# Patient Record
Sex: Female | Born: 1995 | Hispanic: No | Marital: Single | Smoking: Never smoker
Health system: Southern US, Community
[De-identification: ages and names within clinical notes are randomized; demographics above are authoritative.]

## PROBLEM LIST (undated history)

## (undated) DIAGNOSIS — J45909 Unspecified asthma, uncomplicated: Secondary | ICD-10-CM

## (undated) HISTORY — PX: MOUTH SURGERY: SHX715

---
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
## 2013-01-30 ENCOUNTER — Encounter (HOSPITAL_COMMUNITY): Payer: Self-pay | Admitting: Emergency Medicine

## 2013-01-30 ENCOUNTER — Emergency Department (INDEPENDENT_AMBULATORY_CARE_PROVIDER_SITE_OTHER)
Admission: EM | Admit: 2013-01-30 | Discharge: 2013-01-30 | Disposition: A | Discharge status: Home / Self Care | Payer: Medicaid Other | Source: Home / Self Care | Attending: Emergency Medicine | Admitting: Emergency Medicine

## 2013-01-30 DIAGNOSIS — G510 Bell's palsy: Secondary | ICD-10-CM

## 2013-01-30 HISTORY — DX: Unspecified asthma, uncomplicated: J45.909

## 2013-01-30 MED ORDER — PREDNISONE 20 MG PO TABS: ORAL_TABLET | ORAL | Status: AC

## 2013-01-30 MED ORDER — VALACYCLOVIR HCL 1 G PO TABS: 1000.0000 mg | ORAL_TABLET | Freq: Three times a day (TID) | ORAL | Status: AC

## 2013-01-30 MED ORDER — POLYETHYL GLYCOL-PROPYL GLYCOL 0.4-0.3 % OP GEL: OPHTHALMIC | Status: AC

## 2013-01-30 MED ORDER — POLYETHYL GLYCOL-PROPYL GLYCOL 0.4-0.3 % OP SOLN: OPHTHALMIC | Status: AC

## 2013-01-30 NOTE — ED Notes (Signed)
Pt c/o facial numbness of left side onset 01/28/13 Has noticed asymmetrical mouth and eyes, left ear pain and left eye irritation Only face is affected She is alert and oriented w/no signs of acute distress.

## 2013-01-30 NOTE — ED Notes (Signed)
Evaluated patient at registration.  Initially noticed mouth numbness yesterday.  Today woke with severe ear pain, followed by left side of face not moving like usual.  Asymmetrical  Left and right facial movement.

## 2013-01-30 NOTE — ED Provider Notes (Signed)
Chief Complaint:   Chief Complaint  Patient presents with  . Facial Droop    History of Present Illness:   Anita Burns is a 17 year old female who has had a three-day history of left facial weakness and numbness. She's had some pain and dryness of the left eye, left ear pain, her tongue seems numb, her mouth is dry, and she's had alteration in her sense of taste. She had a slight headache but no fever or chills. She denies any stiff neck or swollen glands. There is no skin rash. No history of diabetes or tick bite. She denies any weakness of the upper lower extremities, numbness in the upper or lower extremities, difficulty with speech, swallowing, ambulation, coordination, or balance.  Review of Systems:  Other than noted above, the patient denies any of the following symptoms: Systemic:  No fever, chills, fatigue, photophobia, stiff neck. Eye:  No redness, eye pain, discharge, blurred vision, or diplopia. ENT:  No nasal congestion, rhinorrhea, sinus pressure or pain, sneezing, earache, or sore throat.  No jaw claudication. Neuro:  No paresthesias, loss of consciousness, seizure activity, muscle weakness, trouble with coordination or gait, trouble speaking or swallowing. Psych:  No depression, anxiety or trouble sleeping.  PMFSH:  Past medical history, family history, social history, meds, and allergies were reviewed.    Physical Exam:   Vital signs:  BP 128/76  Pulse 103  Temp(Src) 98.9 F (37.2 C) (Oral)  Resp 16  SpO2 99%  LMP 01/28/2013 General:  Alert and oriented.  In no distress. Eye:  Lids and conjunctivas normal.  PERRL,  Full EOMs.  Fundi benign with normal discs and vessels. Her left eye is watering, but the cornea appears intact. ENT:  No cranial or facial tenderness to palpation.  TMs and canals clear.  Nasal mucosa was normal and uncongested without any drainage. No intra oral lesions, pharynx clear, mucous membranes moist, dentition normal. Neck:  Supple, full ROM, no  tenderness to palpation.  No adenopathy or mass. No carotid bruit. Lungs: Clear to auscultation. Heart: Regular rhythm, no gallop or murmur. Neuro:  Alert and orented times 3.  Speech was clear, fluent, and appropriate.  She has a moderate left peripheral seventh nerve palsy, she is able to close her eye completely. No pronator drift, muscle strength normal. Finger to nose normal.  DTRs were 2+ and symmetrical.Station and gait were normal.  Romberg's sign was normal.  Able to perform tandem gait well. Psych:  Normal affect.  Assessment:  The encounter diagnosis was Bell's palsy.  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 Will treat with prednisone taper and Valtrex. She was given Systane drops and gel to prevent dryness of the eye and suggested followup with a neurologist and ophthalmologist.  Plan:   1.  The following meds were prescribed:   Discharge Medication List as of 01/30/2013  7:19 PM    START taking these medications   Details  Polyethyl Glycol-Propyl Glycol (SYSTANE) 0.4-0.3 % GEL Apply a small dab to left eye at bedtime, Normal    Polyethyl Glycol-Propyl Glycol (SYSTANE) 0.4-0.3 % SOLN 1 drop in left eye every 2 hours while awake, Normal    predniSONE (DELTASONE) 20 MG tablet 3 daily for 3 days, 2 daily for 3 days, 1 daily for 3 days., Normal    valACYclovir (VALTREX) 1000 MG tablet Take 1 tablet (1,000 mg total) by mouth 3 (three) times daily., Starting 01/30/2013, Last dose on Fri 02/13/13, Normal  2.  The patient was instructed in symptomatic care and handouts were given. 3.  The patient was told to return if becoming worse in any way, if no better in 3 or 4 days, and given some red flag symptoms such as fever or changing neurological symptoms that would indicate earlier return. 4.  Follow up with Dr. Vickey Huger and Dr. Charlotte Sanes for neurology and ophthalmology respectively.    Reuben Likes, MD 01/30/13 2215

## 2013-11-26 ENCOUNTER — Encounter (HOSPITAL_COMMUNITY): Payer: Self-pay | Admitting: Emergency Medicine

## 2013-11-26 ENCOUNTER — Emergency Department (INDEPENDENT_AMBULATORY_CARE_PROVIDER_SITE_OTHER)
Admission: EM | Admit: 2013-11-26 | Discharge: 2013-11-26 | Disposition: A | Discharge status: Home / Self Care | Payer: Medicaid Other | Source: Home / Self Care | Attending: Emergency Medicine | Admitting: Emergency Medicine

## 2013-11-26 DIAGNOSIS — S8000XA Contusion of unspecified knee, initial encounter: Secondary | ICD-10-CM

## 2013-11-26 DIAGNOSIS — S39012A Strain of muscle, fascia and tendon of lower back, initial encounter: Secondary | ICD-10-CM

## 2013-11-26 DIAGNOSIS — S335XXA Sprain of ligaments of lumbar spine, initial encounter: Secondary | ICD-10-CM

## 2013-11-26 DIAGNOSIS — Y92410 Unspecified street and highway as the place of occurrence of the external cause: Secondary | ICD-10-CM

## 2013-11-26 DIAGNOSIS — J069 Acute upper respiratory infection, unspecified: Secondary | ICD-10-CM

## 2013-11-26 LAB — POCT RAPID STREP A: Streptococcus, Group A Screen (Direct): NEGATIVE

## 2013-11-26 MED ORDER — AMOXICILLIN-POT CLAVULANATE 875-125 MG PO TABS: 1.0000 | ORAL_TABLET | Freq: Two times a day (BID) | ORAL | Status: AC

## 2013-11-26 MED ORDER — BENZONATATE 200 MG PO CAPS: 200.0000 mg | ORAL_CAPSULE | Freq: Three times a day (TID) | ORAL | Status: AC | PRN

## 2013-11-26 MED ORDER — METHOCARBAMOL 500 MG PO TABS: 500.0000 mg | ORAL_TABLET | Freq: Three times a day (TID) | ORAL | Status: AC

## 2013-11-26 MED ORDER — MELOXICAM 15 MG PO TABS: 15.0000 mg | ORAL_TABLET | Freq: Every day | ORAL | Status: AC

## 2013-11-26 MED ORDER — IPRATROPIUM BROMIDE 0.06 % NA SOLN: 2.0000 | Freq: Four times a day (QID) | NASAL | Status: AC

## 2013-11-26 MED ORDER — TRAMADOL HCL 50 MG PO TABS: 100.0000 mg | ORAL_TABLET | Freq: Three times a day (TID) | ORAL | Status: DC | PRN
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
## 2013-11-26 NOTE — Discharge Instructions (Signed)
Do exercises twice daily followed by moist heat for 15 minutes.      Try to be as active as possible.  If no better in 2 weeks, follow up with orthopedist.   Knee pain can be caused by many conditions:  Osteoarthritis, gout, bursitis, tendonitis, cartiledge damage, condromalacia patella, patellofemoral syndrome, and ligament sprain to name just a few.  Often some simple conservative measures can help alleviate the pain.  Do not do the following:  Avoid squatting and doing deep knee bends.  This puts too much of load on your cartiledges and tendons.  If you do a knee bend, go only half way down, flexing your knee no more than 90 degrees.  Do the following:  If you are overweight or obese, lose weight.  This makes for a lot less load on your knee joints.  If you use tobacco, quit.  Nicotine causes spasm of the small arteries, decreases blood flow, and impairs your body's normal ability to repair damage.  If your knee is acutely inflamed, use the principles of RICE (rest, ice, compression, and elevation).  Wearing a knee brace can help.  These are usually made of neoprene and can be purchased over the counter at the drug store.  Use of over the counter pain meds can be of help.  Tylenol (or acetaminophen) is the safest to use.  It often helps to take this regularly.  You can take up to 2 325 mg tablets 5 times daily, but it best to start out much lower that that, perhaps 2 325 mg tablets twice daily, then increase from there. People who are on the blood thinner warfarin have to be careful about taking high doses of Tylenol.  For people who are able to tolerate them, ibuprofen and naproxyn can also help with the pain.  You should discuss these agents with your physician before taking them.  People with chronic kidney disease, hypertension, peptic ulcer disease, and reflux can suffer adverse side effects. They should not be taken with warfarin. The maximum dosage of ibuprofen is 800 mg 3 times  daily with meals.  The maximum dosage of naprosyn is 2 and 1/2 tablets twice daily with food, but again, start out low and gradually increase the dose until adequate pain relief is achieved. Ibuprofen and naprosyn should always be taken with food.  People with cartiledge injury or osteoarthritis may find glucosamine to be helpful.  This is an over-the-counter supplement that helps nourish and repair cartiledge.  The dose is 500 mg 3 times daily or 1500 mg taken in a single dose. This can take several months to work and it doesn't always work.    For people with knee pain on just one side, use of a cane held in the hand on the same side as the knee pain takes some of the stress off the knee joint and can make a big difference in knee pain.  Wearing good shoes with adequate arch support is essential.  Regular exercise is of utmost importance.  Swimming, water aerobics, or use of an elliptical exerciser put the least stress on the knees of any exercise.  Finally doing the exercises below can be very helpful.  They tend to strengthen the muscles around the knee and provide extra support and stability.  Try to do them twice a day followed by ice for 10 minutes.       Most upper respiratory infections are caused by viruses and do not require antibiotics.  We  try to save the antibiotics for when we really need them to prevent bacteria from developing resistance to them.  Here are a few hints about things that can be done at home to help get over an upper respiratory infection quicker:  Get extra sleep and extra fluids.  Get 7 to 9 hours of sleep per night and 6 to 8 glasses of water a day.  Getting extra sleep keeps the immune system from getting run down.  Most people with an upper respiratory infection are a little dehydrated.  The extra fluids also keep the secretions liquified and easier to deal with.  Also, get extra vitamin C.  4000 mg per day is the recommended dose. For the aches, headache, and  fever, acetaminophen or ibuprofen are helpful.  These can be alternated every 4 hours.  People with liver disease should avoid large amounts of acetaminophen, and people with ulcer disease, gastroesophageal reflux, gastritis, congestive heart failure, chronic kidney disease, coronary artery disease and the elderly should avoid ibuprofen. For nasal congestion try Mucinex-D, or if you're having lots of sneezing or clear nasal drainage use Zyrtec-D. People with high blood pressure can take these if their blood pressure is controlled, if not, it's best to avoid the forms with a "D" (decongestants).  You can use the plain Mucinex, Allegra, Claritin, or Zyrtec even if your blood pressure is not controlled.   A Saline nasal spray such as Ocean Spray can also help.  You can add a decongestant sprays such as Afrin, but you should not use the decongestant sprays for more than 3 or 4 days since they can be habituating.  Breathe Rite nasal strips can also offer a non-drug alternative treatment to nasal congestion, especially at night. For people with symptoms of sinusitis, sleeping with your head elevated can be helpful.  For sinus pain, moist, hot compresses to the face may provide some relief.  Many people find that inhaling steam as in a shower or from a pot of steaming water can help. For any viral infection, zinc containing lozenges such as Cold-Eze or Zicam are helpful.  Zinc helps to fight viral infection.  Hot salt water gargles (8 oz of hot water, 1/2 tsp of table salt, and a pinch of baking soda) can give relief as well as hot beverages such as hot tea.  Sucrets extra strength lozenges will help the sore throat.  For the cough, take Delsym 2 tsp every 12 hours.  It has also been found recently that Aleve can help control a cough.  The dose is 1 to 2 tablets twice daily with food.  This can be combined with Delsym. (Note, if you are taking ibuprofen, you should not take Aleve as well--take one or the other.) A  cool mist vaporizer will help keep your mucous membranes from drying out.   It's important when you have an upper respiratory infection not to pass the infection to others.  This involves being very careful about the following:  Frequent hand washing or use of hand sanitizer, especially after coughing, sneezing, blowing your nose or touching your face, nose or eyes. Do not shake hands or touch anyone and try to avoid touching surfaces that other people use such as doorknobs, shopping carts, telephones and computer keyboards. Use tissues and dispose of them properly in a garbage can or ziplock bag. Cough into your sleeve. Do not let others eat or drink after you.  It's also important to recognize the signs of serious illness  and get evaluated if they occur: Any respiratory infection that lasts more than 7 to 10 days.  Yellow nasal drainage and sputum are not reliable indicators of a bacterial infection, but if they last for more than 1 week, see your doctor. Fever and sore throat can indicate strep. Fever and cough can indicate influenza or pneumonia. Any kind of severe symptom such as difficulty breathing, intractable vomiting, or severe pain should prompt you to see a doctor as soon as possible.   Your body's immune system is really the thing that will get rid of this infection.  Your immune system is comprised of 2 types of specialized cells called T cells and B cells.  T cells coordinate the array of cells in your body that engulf invading bacteria or viruses while B cells orchestrate the production of antibodies that neutralize infection.  Anything we do or any medications we give you, will just strengthen your immune system or help it clear up the infection quicker.  Here are a few helpful hints to improve your immune system to help overcome this illness or to prevent future infections:  A few vitamins can improve the health of your immune system.  That's why your diet should include plenty  of fruits, vegetables, fish, nuts, and whole grains.  Vitamin A and bet-carotene can increase the cells that fight infections (T cells and B cells).  Vitamin A is abundant in dark greens and orange vegetables such as spinach, greens, sweet potatoes, and carrots.  Vitamin B6 contributes to the maturation of white blood cells, the cells that fight disease.  Foods with vitamin B6 include cold cereal and bananas.  Vitamin C is credited with preventing colds because it increases white blood cells and also prevents cellular damage.  Citrus fruits, peaches and green and red bell peppers are all hight in vitamin C.  Vitamin E is an anti-oxidant that encourages the production of natural killer cells which reject foreign invaders and B cells that produce antibodies.  Foods high in vitamin E include wheat germ, nuts and seeds.  Foods high in omega-3 fatty acids found in foods like salmon, tuna and mackerel boost your immune system and help cells to engulf and absorb germs.  Probiotics are good bacteria that increase your T cells.  These can be found in yogurt and are available in supplements such as Culturelle or Align.  Moderate exercise increases the strength of your immune system and your ability to recover from illness.  I suggest 3 to 5 moderate intensity 30 minute workouts per week.    Sleep is another component of maintaining a strong immune system.  It enables your body to recuperate from the day's activities, stress and work.  My recommendation is to get between 7 and 9 hours of sleep per night.  If you smoke, try to quit completely or at least cut down.  Drink alcohol only in moderation if at all.  No more than 2 drinks daily for men or 1 for women.  Get a flu vaccine early in the fall or if you have not gotten one yet, once this illness has run its course.  If you are over 65, a smoker, or an asthmatic, get a pneumococcal vaccine.  My final recommendation is to maintain a healthy weight.   Excess weight can impair the immune system by interfering with the way the immune system deals with invading viruses or bacteria.  Do not get antibiotic filled unless no better in 2 to 3  days.
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
## 2013-11-28 LAB — CULTURE, GROUP A STREP

## 2014-09-22 ENCOUNTER — Emergency Department (INDEPENDENT_AMBULATORY_CARE_PROVIDER_SITE_OTHER)
Admission: EM | Admit: 2014-09-22 | Discharge: 2014-09-22 | Disposition: A | Discharge status: Home / Self Care | Payer: Medicaid Other | Source: Home / Self Care | Attending: Emergency Medicine | Admitting: Emergency Medicine

## 2014-09-22 ENCOUNTER — Emergency Department (INDEPENDENT_AMBULATORY_CARE_PROVIDER_SITE_OTHER): Payer: Medicaid Other

## 2014-09-22 ENCOUNTER — Encounter (HOSPITAL_COMMUNITY): Payer: Self-pay

## 2014-09-22 DIAGNOSIS — S46111A Strain of muscle, fascia and tendon of long head of biceps, right arm, initial encounter: Secondary | ICD-10-CM

## 2014-09-22 DIAGNOSIS — S161XXA Strain of muscle, fascia and tendon at neck level, initial encounter: Secondary | ICD-10-CM

## 2014-09-22 DIAGNOSIS — S46211A Strain of muscle, fascia and tendon of other parts of biceps, right arm, initial encounter: Secondary | ICD-10-CM

## 2014-09-22 DIAGNOSIS — M7052 Other bursitis of knee, left knee: Secondary | ICD-10-CM | POA: Diagnosis not present

## 2014-09-22 MED ORDER — TRAMADOL HCL 50 MG PO TABS: 50.0000 mg | ORAL_TABLET | Freq: Four times a day (QID) | ORAL | Status: AC | PRN

## 2014-09-22 MED ORDER — IBUPROFEN 800 MG PO TABS: 800.0000 mg | ORAL_TABLET | Freq: Three times a day (TID) | ORAL | Status: AC

## 2014-09-22 NOTE — Discharge Instructions (Signed)
Take ibuprofen 800 mg 3 times a day for the next week, then as needed. Rest your arm and leg as much as possible. Apply ice for 10-20 minutes 3 times a day for the next few days. Use tramadol every 6 hours as needed for severe pain. Follow-up if no improvement in 1-2 weeks.
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
## 2014-09-22 NOTE — ED Provider Notes (Signed)
CSN: [id number]     Arrival date & time 09/22/14  1112 History   First MD Initiated Contact with Patient 09/22/14 1154     Chief Complaint  Patient presents with  . Fall   (Consider location/radiation/quality/duration/timing/severity/associated sxs/prior Treatment) HPI  She is a 19 year old woman here for evaluation of left knee pain and right shoulder pain following a fall. She states she slipped and fell yesterday. She landed hard on her left knee and also twisted her right upper arm. The knee pain is the worse. It is located anteriorly. It is worse with stairs. The shoulder pain is only present with certain movements. She also reports some pain in her left neck. She states the symptoms started over the weekend and worsened after the fall. She has tried Tylenol without improvement.  Past Medical History  Diagnosis Date  . Asthma    Past Surgical History  Procedure Laterality Date  . Mouth surgery     History reviewed. No pertinent family history. History  Substance Use Topics  . Smoking status: Never Smoker   . Smokeless tobacco: Not on file  . Alcohol Use: No   OB History    No data available     Review of Systems As in history of present illness Allergies  Review of patient's allergies indicates no known allergies.  Home Medications   Prior to Admission medications   Medication Sig Start Date End Date Taking? Authorizing Provider  amoxicillin-clavulanate (AUGMENTIN) 875-125 MG per tablet Take 1 tablet by mouth 2 (two) times daily. 11/26/13   Reuben Likesavid C Keller, MD  benzonatate (TESSALON) 200 MG capsule Take 1 capsule (200 mg total) by mouth 3 (three) times daily as needed for cough. 11/26/13   Reuben Likesavid C Keller, MD  ibuprofen (ADVIL,MOTRIN) 800 MG tablet Take 1 tablet (800 mg total) by mouth 3 (three) times daily. 09/22/14   Charm RingsErin J Findley Vi, MD  ipratropium (ATROVENT) 0.06 % nasal spray Place 2 sprays into both nostrils 4 (four) times daily. 11/26/13   Reuben Likesavid C Keller, MD  meloxicam  (MOBIC) 15 MG tablet Take 1 tablet (15 mg total) by mouth daily. 11/26/13   Reuben Likesavid C Keller, MD  methocarbamol (ROBAXIN) 500 MG tablet Take 1 tablet (500 mg total) by mouth 3 (three) times daily. 11/26/13   Reuben Likesavid C Keller, MD  Polyethyl Glycol-Propyl Glycol (SYSTANE) 0.4-0.3 % GEL Apply a small dab to left eye at bedtime 01/30/13   Reuben Likesavid C Keller, MD  Polyethyl Glycol-Propyl Glycol (SYSTANE) 0.4-0.3 % SOLN 1 drop in left eye every 2 hours while awake 01/30/13   Reuben Likesavid C Keller, MD  predniSONE (DELTASONE) 20 MG tablet 3 daily for 3 days, 2 daily for 3 days, 1 daily for 3 days. 01/30/13   Reuben Likesavid C Keller, MD  traMADol (ULTRAM) 50 MG tablet Take 1 tablet (50 mg total) by mouth every 6 (six) hours as needed. 09/22/14   Charm RingsErin J Alyene Predmore, MD   BP 116/70 mmHg  Pulse 98  Temp(Src) 97.9 F (36.6 C) (Oral)  SpO2 99%  LMP 09/08/2014 Physical Exam  Constitutional: She is oriented to person, place, and time. She appears well-developed and well-nourished. No distress.  Neck: Neck supple.  Mild spasm in left superior trapezius.  Cardiovascular: Normal rate.   Pulmonary/Chest: Effort normal.  Musculoskeletal:  Left knee: Small abrasion over her kneecap. No swelling or erythema. No joint effusion. She is tender over the patella and patellar tendon. No joint line tenderness. No crepitus. No joint instability. Right shoulder: No erythema  or swelling. Tender over her biceps tendon and supraspinatus. Positive empty can.  5 out of 5 strength in triceps and biceps.  Neurological: She is alert and oriented to person, place, and time.    ED Course  Procedures (including critical care time) Labs Review Labs Reviewed - No data to display  Imaging Review Dg Knee Complete 4 Views Left  09/22/2014   CLINICAL DATA:  19 year old female who fell yesterday onto left knee with lateral patellar region pain. Initial encounter.  EXAM: LEFT KNEE - COMPLETE 4+ VIEW  COMPARISON:  None.  FINDINGS: Large body habitus. Bone mineralization is  within normal limits. Patella intact. No joint effusion. Joint spaces appear preserved. No acute fracture or dislocation identified.  IMPRESSION: No acute fracture or dislocation identified about the left knee.   Electronically Signed   By: Augusto Gamble M.D.   On: 09/22/2014 12:53     MDM   1. Patellar bursitis, left   2. Biceps strain, right, initial encounter   3. Cervical strain, acute, initial encounter    Conservative management with rest, ice, ibuprofen. Tramadol prescription given for severe pain. Follow-up if no improvement in 1-2 weeks.    Charm Rings, MD 09/22/14 1323

## 2015-10-05 IMAGING — DX DG KNEE COMPLETE 4+V*L*
4 series · 4 of 4 positions shown · non-contrast
Comparison: None.

CLINICAL DATA: 18-year-old female who fell yesterday onto left knee
with lateral patellar region pain. Initial encounter.

EXAM:
LEFT KNEE - COMPLETE 4+ VIEW

[knee ap]
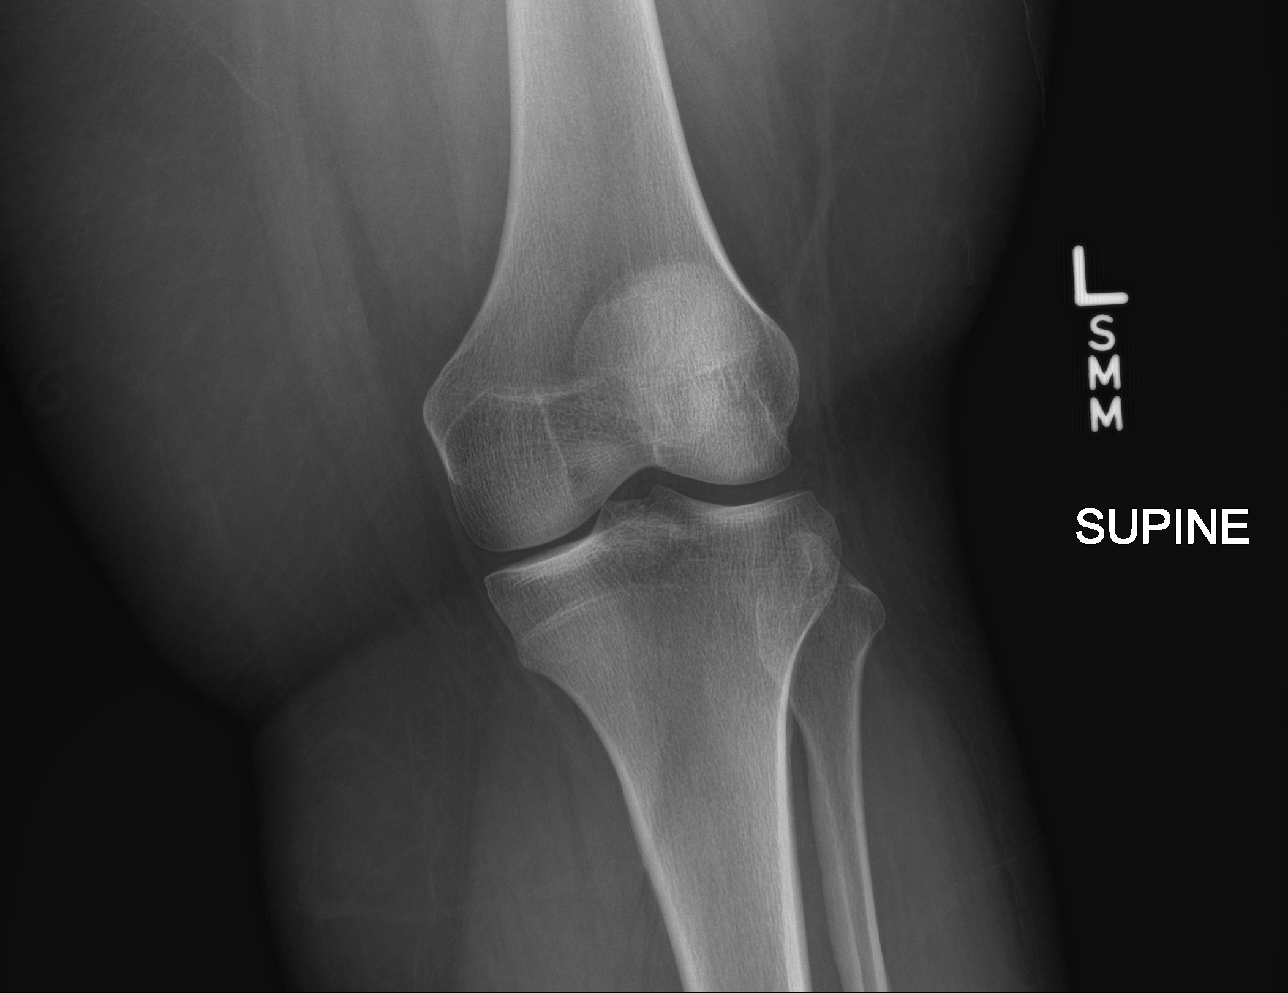

[knee obl (1 of 2)]
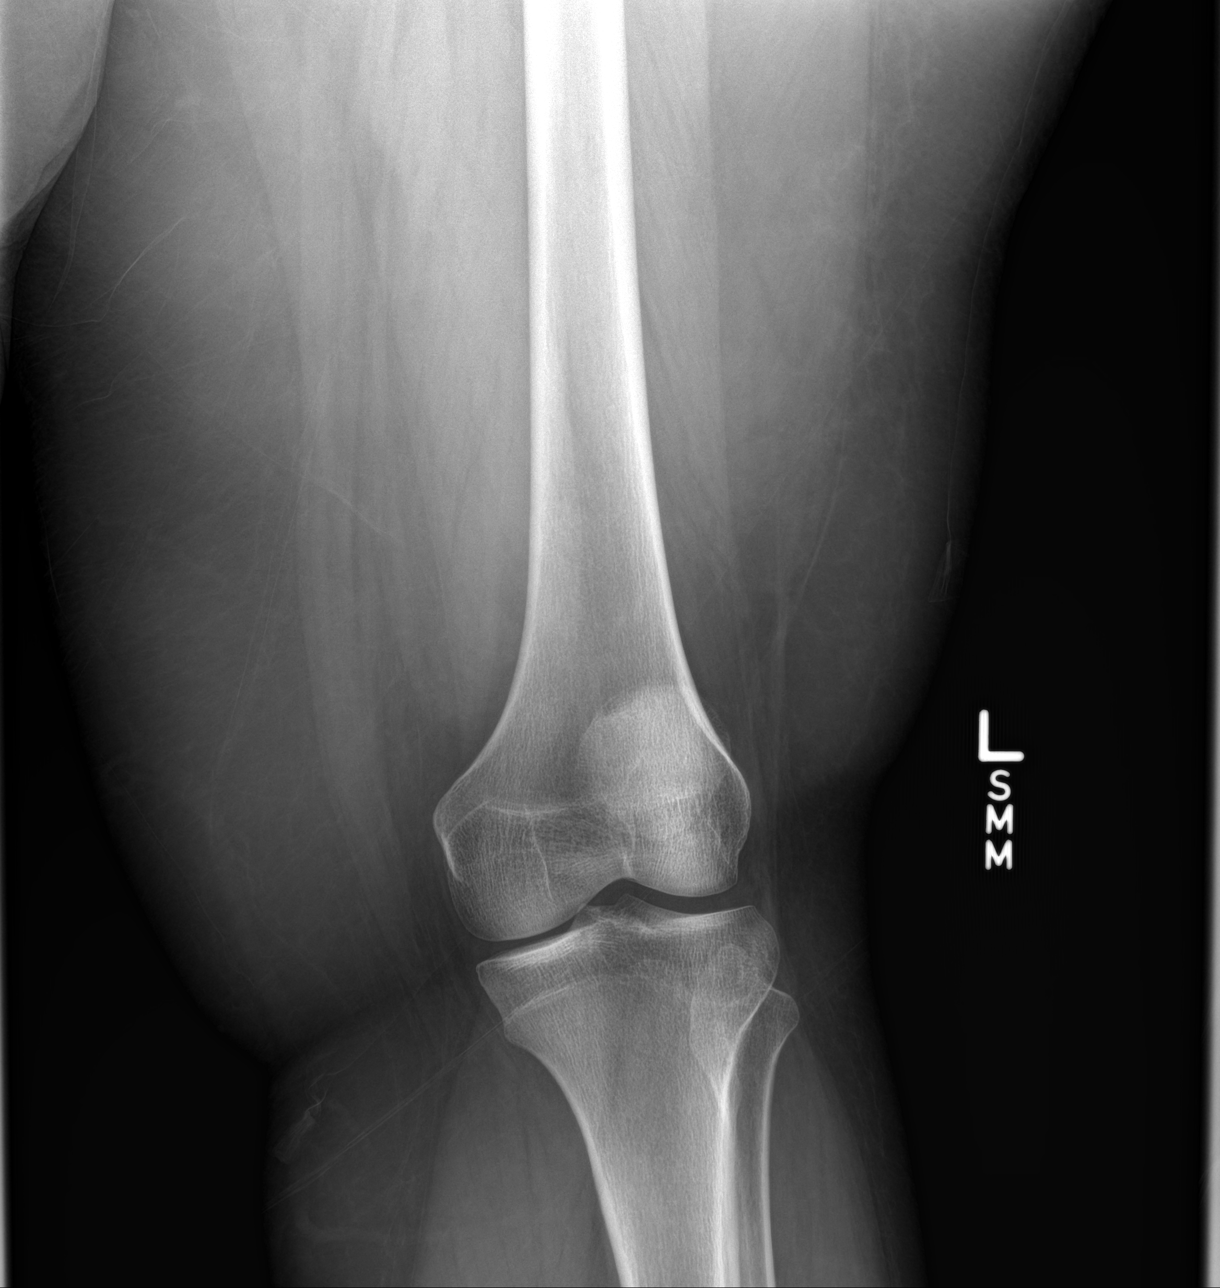

[knee obl (2 of 2)]
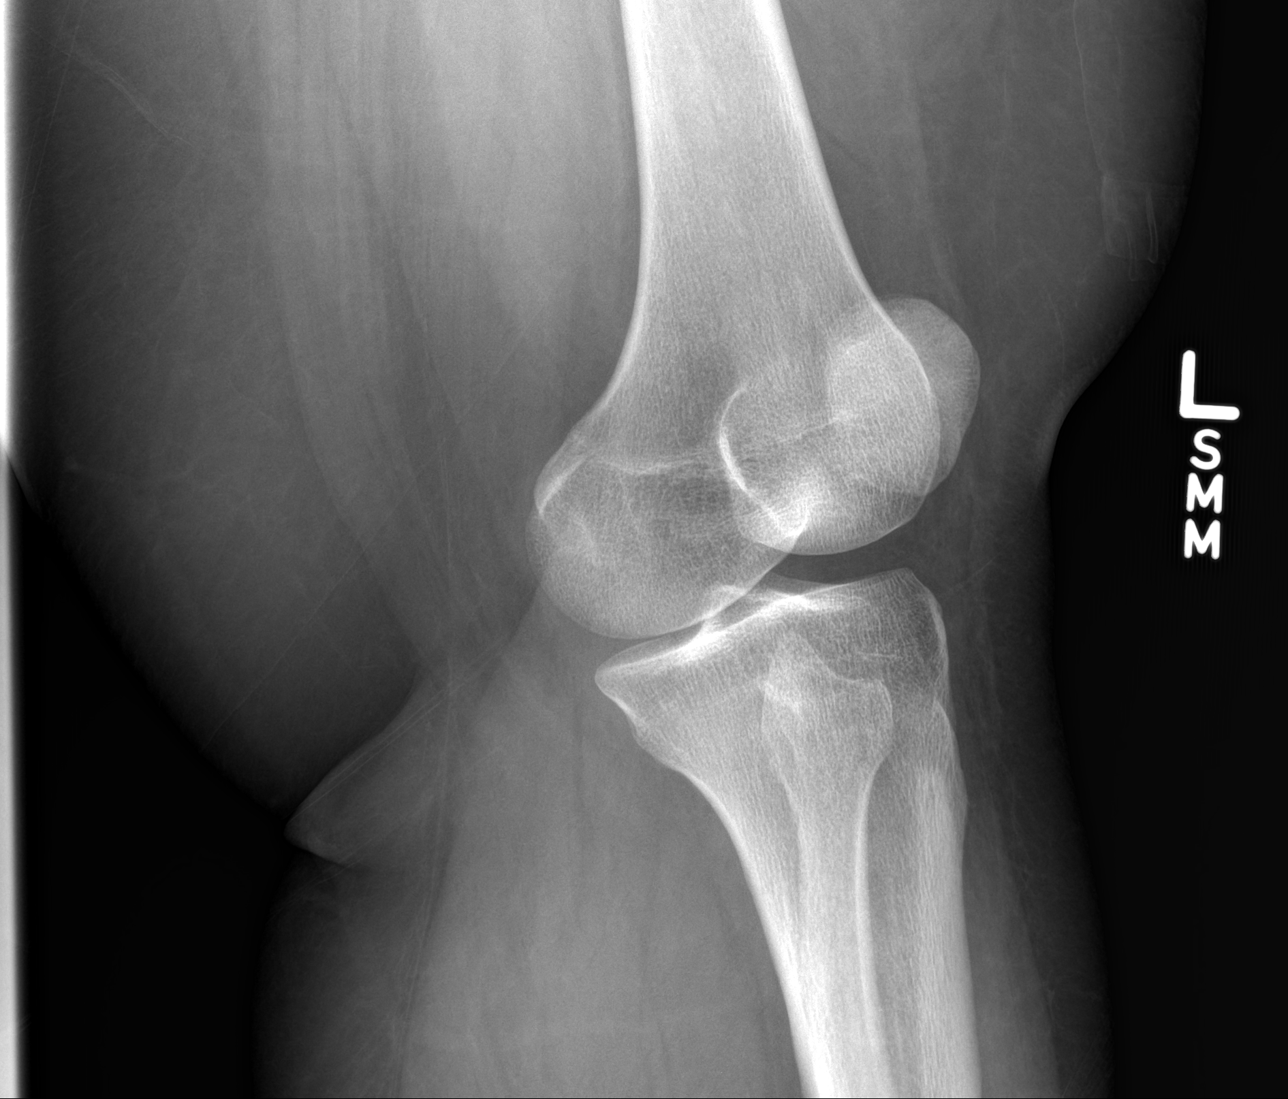

[knee lat]
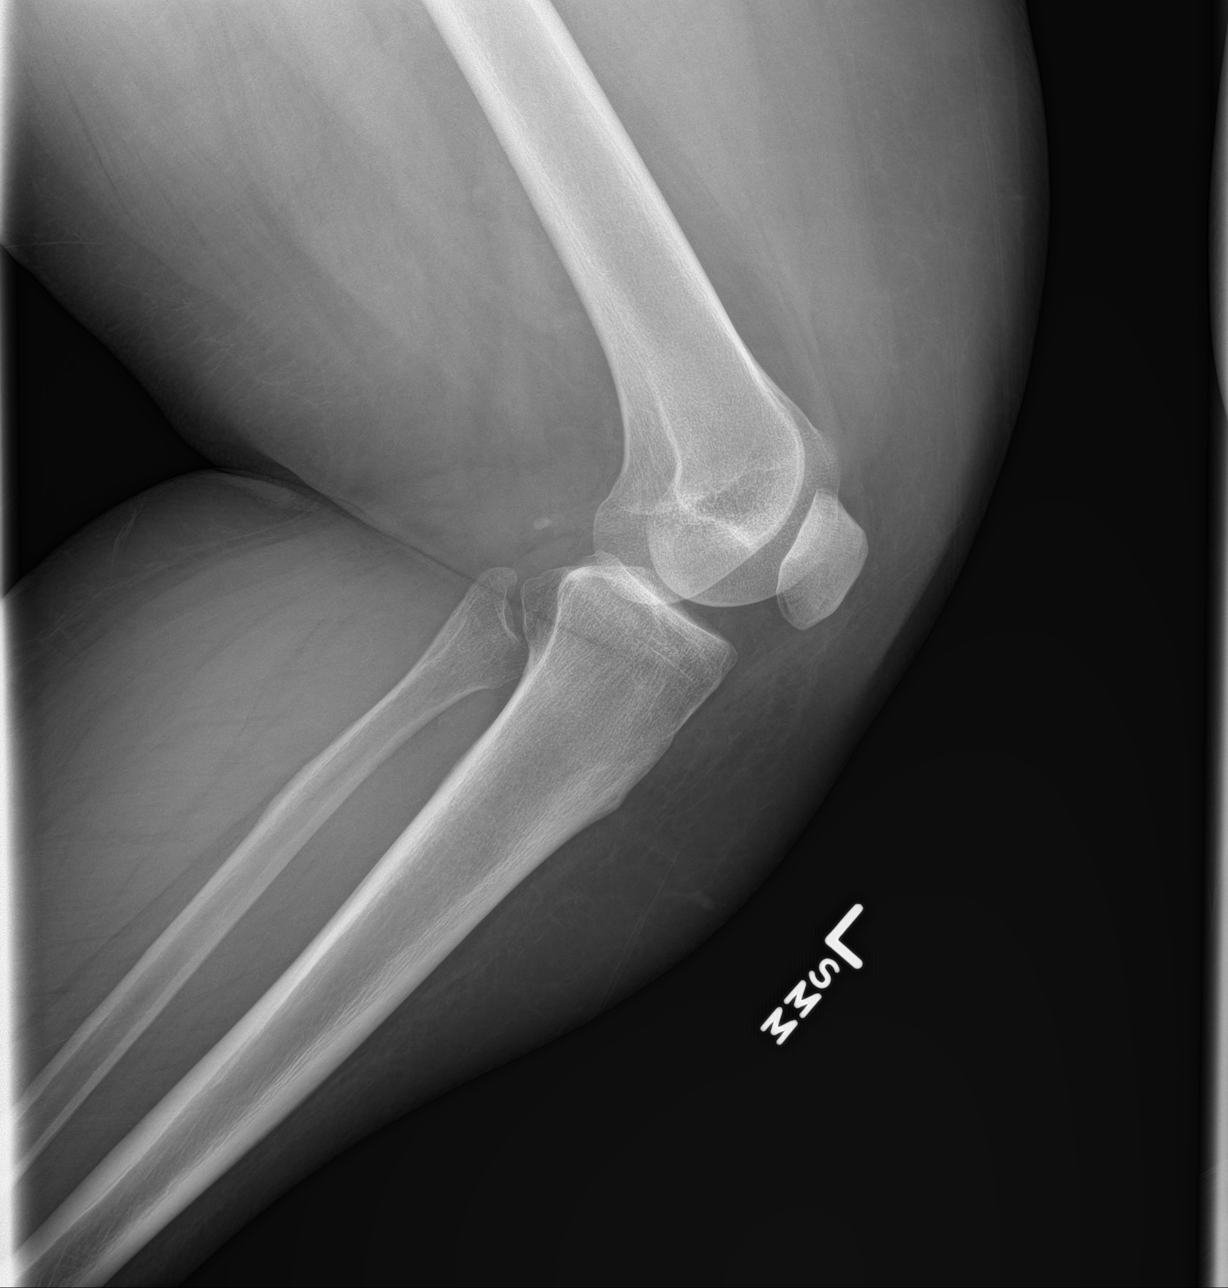

[4 of 4 positions shown; findings below may reference images not displayed]

FINDINGS: Large body habitus. Bone mineralization is within normal limits.
Patella intact. No joint effusion. Joint spaces appear preserved. No
acute fracture or dislocation identified.
IMPRESSION: No acute fracture or dislocation identified about the left knee.
